# Patient Record
Sex: Female | Born: 1975 | Race: Black or African American | Hispanic: No | Marital: Married | Smoking: Never smoker
Health system: Southern US, Community
[De-identification: ages and names within clinical notes are randomized; demographics above are authoritative.]

---

## 2005-02-05 ENCOUNTER — Inpatient Hospital Stay (HOSPITAL_COMMUNITY): Admission: AD | Admit: 2005-02-05 | Discharge: 2005-02-05 | Payer: Self-pay | Admitting: *Deleted

## 2005-02-15 ENCOUNTER — Ambulatory Visit (HOSPITAL_COMMUNITY): Admission: RE | Admit: 2005-02-15 | Discharge: 2005-02-15 | Payer: Self-pay | Admitting: Obstetrics & Gynecology

## 2005-02-17 ENCOUNTER — Inpatient Hospital Stay (HOSPITAL_COMMUNITY): Admission: AD | Admit: 2005-02-17 | Discharge: 2005-02-20 | Payer: Self-pay | Admitting: Obstetrics

## 2005-02-18 ENCOUNTER — Encounter (INDEPENDENT_AMBULATORY_CARE_PROVIDER_SITE_OTHER): Payer: Self-pay | Admitting: Specialist

## 2006-05-03 HISTORY — PX: OTHER SURGICAL HISTORY: SHX169

## 2006-06-02 ENCOUNTER — Ambulatory Visit (HOSPITAL_COMMUNITY): Admission: RE | Admit: 2006-06-02 | Discharge: 2006-06-02 | Payer: Self-pay | Admitting: Obstetrics

## 2006-09-14 ENCOUNTER — Inpatient Hospital Stay (HOSPITAL_COMMUNITY): Admission: AD | Admit: 2006-09-14 | Discharge: 2006-09-16 | Payer: Self-pay | Admitting: Obstetrics

## 2006-09-15 ENCOUNTER — Encounter (INDEPENDENT_AMBULATORY_CARE_PROVIDER_SITE_OTHER): Payer: Self-pay | Admitting: Specialist

## 2010-09-15 NOTE — Op Note (Signed)
NAME:  Joan Adams, Joan Adams NO.:  [id number]   MEDICAL RECORD NO.:  [id number]          PATIENT TYPE:  INP   LOCATION:  9111                          FACILITY:  [facility]   PHYSICIAN:  Charles A. Clearance Coots, M.D.DATE OF BIRTH:  10/06/1975   DATE OF PROCEDURE:  09/15/2006  DATE OF DISCHARGE:                               OPERATIVE REPORT   PREOPERATIVE DIAGNOSIS:  Desires sterilization.   POSTOPERATIVE DIAGNOSIS:  Desires sterilization.   PROCEDURE:  Bilateral partial salpingectomy.   SURGEON:  Charles A. Clearance Coots, M.D.   ANESTHESIA:  Spinal.   ESTIMATED BLOOD LOSS:  Negligible.   COMPLICATIONS:  None.   SPECIMEN:  Approximately 2 cm segments of right and left fallopian  tubes.   OPERATION:  The patient was brought to the operating room, and after  satisfactory general endotracheal anesthesia, the abdomen was prepped  and draped in the usual sterile fashion.   A small inferior umbilical incision was made with a scalpel that was  deepened down to the fascia with curved Mayo scissors bluntly.  Right-  angle retractors were then placed in the incision, and the fascia was  grasped in the midline with Kocher forceps and was cut transversely with  curved Mayo scissors.  The fascial incision was extended to the left and  to the right with the curved Mayo scissors.  The peritoneum was grasped  with hemostats and was incised with Metzenbaum scissors.  Right-angle  retractors were then placed in the incision.  The right fallopian tube  was identified and was grasped with a Babcock clamp.  The tube was  followed from the cornual and to the fimbrial end and grasped with  Babcock clamps and was then regrasped in the isthmic area of the tube  with the Babcock clamp.  A knuckle of tube beneath the Babcock clamp was  ligated with 0 plain catgut, and the section of tube above the knot was  excised with Metzenbaum scissors and submitted to pathology for  evaluation.  There was no active  bleeding from the tubal stump.  It was  therefore placed back in its normal anatomic position.  The same  procedure was performed on the opposite side without complications.  The  abdomen was then closed as follows.  The peritoneum and fascia was  closed as one with continuous suture of 2-0 Vicryl.  The skin was closed  with continuous  subcuticular suture of 3-0 Monocryl.  A sterile bandage was applied to  the incision closure.  The surgical technician indicated that all  needle, sponge, and instrument counts were correct x2.   The patient tolerated procedure well and was transported to the recovery  room in satisfactory condition.      Charles A. Clearance Coots, M.D.  Electronically Signed     CAH/MEDQ  D:  09/15/2006  T:  09/15/2006  Job:  [id number]

## 2010-09-15 NOTE — Discharge Summary (Signed)
NAME:  Joan Adams, Joan Adams NO.:  [id number]   MEDICAL RECORD NO.:  [id number]          PATIENT TYPE:  INP   LOCATION:  9111                          FACILITY:  [facility]   PHYSICIAN:  Charles A. Clearance Coots, M.D.DATE OF BIRTH:  1975/10/27   DATE OF ADMISSION:  09/14/2006  DATE OF DISCHARGE:  09/16/2006                               DISCHARGE SUMMARY   ADMITTING DIAGNOSIS:  Term pregnancy active labor, desires  sterilization.   DISCHARGE DIAGNOSIS:  Term pregnancy active labor, desires  sterilization, status post normal spontaneous vaginal delivery, viable  alive female on 09/14/2006 at 2057.  Apgar's of 9 at 1 minute, 9 at 5  minutes.  Weight of 3300 grams, length of 52.5-cm.   Mother and infant discharged home in good condition.   REASON FOR ADMISSION:  A 35 year old black female G4, P3, EDC of  09/11/2006 presents with uterine contractions.  Prenatal care was  uncomplicated.  Group B strep was negative.   PAST MEDICAL HISTORY:  Surgery none.  Illnesses none.   MEDICATIONS:  Prenatal vitamins.   ALLERGIES:  NO KNOWN DRUG ALLERGIES.   SOCIAL HISTORY:  Married.  Negative tobacco, alcohol or recreational  drug use.   FAMILY HISTORY:  No major diseases.   PHYSICAL EXAMINATION:  GENERAL:  Well nourished, well developed black  female in no acute distress.  VITAL SIGNS:  Blood pressure 131/83.  LUNGS:  Clear to auscultation bilaterally.  HEART:  Regular rate and rhythm.  ABDOMEN:  Gravid, nontender.  CERVIX:  6 to 7-cm, 100% effaced, the vertex at 0 stage.  External fetal  monitor reactive tracing.  Uterine contractions every 3 to 4 minutes.   ADMITTING LABORATORY VALUES:  Hemoglobin 10, hematocrit 33, white blood  cell count 16,000, platelets 197,000.   HOSPITAL COURSE:  The patient was admitted and active rupture of  membranes was performed and she then progressed to a rapid first stage  of labor and uncomplicated normal spontaneous vaginal delivery.  The  patient  is taken for a postpartum tubal ligation on postpartum day  number 1.  Bilateral partial salpingectomy was performed without  complications.  The remainder of the postpartum course and postoperative  course was uncomplicated.  The patient was discharged home on postpartum  day number 2 in good condition.   DISCHARGE LABORATORY VALUES:  Hemoglobin 9.7, hematocrit 28.2, white  blood cell count 12,000, platelets 178,000.   DISCHARGE DISPOSITION:  Medications Tylox and ibuprofen was prescribed  for pain.  Continue prenatal vitamins.  Routine written instructions  were given for discharge after vaginal delivery, tubal ligation.  The  patient is to call office for followup appointment in 2 weeks.      Charles A. Clearance Coots, M.D.  Electronically Signed     CAH/MEDQ  D:  09/16/2006  T:  09/16/2006  Job:  [id number]

## 2011-09-13 ENCOUNTER — Encounter: Payer: Self-pay | Admitting: Obstetrics and Gynecology

## 2011-09-13 ENCOUNTER — Ambulatory Visit (INDEPENDENT_AMBULATORY_CARE_PROVIDER_SITE_OTHER): Payer: BC Managed Care – PPO | Admitting: Obstetrics and Gynecology

## 2011-09-13 VITALS — BP 116/64 | Ht 68.0 in | Wt 185.0 lb

## 2011-09-13 DIAGNOSIS — Z124 Encounter for screening for malignant neoplasm of cervix: Secondary | ICD-10-CM

## 2011-09-13 DIAGNOSIS — E663 Overweight: Secondary | ICD-10-CM

## 2011-09-13 DIAGNOSIS — R102 Pelvic and perineal pain unspecified side: Secondary | ICD-10-CM

## 2011-09-13 DIAGNOSIS — Z9851 Tubal ligation status: Secondary | ICD-10-CM

## 2011-09-13 DIAGNOSIS — N949 Unspecified condition associated with female genital organs and menstrual cycle: Secondary | ICD-10-CM

## 2011-09-13 DIAGNOSIS — Z01419 Encounter for gynecological examination (general) (routine) without abnormal findings: Secondary | ICD-10-CM

## 2011-09-13 DIAGNOSIS — Z113 Encounter for screening for infections with a predominantly sexual mode of transmission: Secondary | ICD-10-CM

## 2011-09-13 MED ORDER — TRAMADOL HCL 50 MG PO TABS: 50.0000 mg | ORAL_TABLET | Freq: Four times a day (QID) | ORAL | Status: AC | PRN

## 2011-09-13 NOTE — Progress Notes (Signed)
The patient reports:she complains of pain states that she believes it is with ovulation.   Contraception:bilateral tubal ligation  Last mammogram: not applicable  Last pap: was normal 2008  GC/Chlamydia cultures offered: requested HIV/RPR/HbsAg offered:  requested HSV 1 and 2 glycoprotein offered:  requested  Menstrual cycle regular and monthly: Yes Menstrual flow normal: Yes  Urinary symptoms: none Normal bowel movements: Yes Reports abuse at home: No:   Subjective:    Joan Adams is a 36 y.o. female, [id number], who presents for an annual exam. See above. This is her first visit to this office.  She complains of a vague left lower quadrant pain at the time of ovulation.  She has normal GI and GU function.  She denies fever and chills.  She is sexually active and wants to rule out sexual transmitted infections.  She is status post tubal ligation. On the worst day her pain is rated at 2/10.  Prior Hysterectomy: No    History   Social History  . Marital Status: Married    Spouse Name: N/A    Number of Children: N/A  . Years of Education: N/A   Social History Main Topics  . Smoking status: Never Smoker   . Smokeless tobacco: None  . Alcohol Use: Yes     occasional   . Drug Use: No  . Sexually Active: Yes    Birth Control/ Protection: Other-see comments     BTL   Other Topics Concern  . None   Social History Narrative  . None    Menstrual cycle:   LMP: Patient's last menstrual period was 09/07/2011.           Cycle: Regular, monthly with normal flow and no severe dysmenorrha  The following portions of the patient's history were reviewed and updated as appropriate: allergies, current medications, past family history, past medical history, past social history, past surgical history and problem list.  Review of Systems Pertinent items are noted in HPI. Breast:Negative for breast lump,nipple discharge or nipple retraction Gastrointestinal: Negative for abdominal pain,  change in bowel habits or rectal bleeding Urinary:negative   Objective:    BP 116/64  Ht 5\' 8"  (1.727 m)  Wt 185 lb (83.915 kg)  BMI 28.13 kg/m2  LMP 09/07/2011    Weight:  Wt Readings from Last 1 Encounters:  09/13/11 185 lb (83.915 kg)          BMI: Body mass index is 28.13 kg/(m^2).  General Appearance: Alert, appropriate appearance for age. No acute distress HEENT: Grossly normal Neck / Thyroid: Supple, no masses, nodes or enlargement Lungs: clear to auscultation bilaterally Back: No CVA tenderness Breast Exam: No masses or nodes.No dimpling, nipple retraction or discharge. Cardiovascular: Regular rate and rhythm. S1, S2, no murmur Gastrointestinal: Soft, non-tender, no masses or organomegaly  ++++++++++++++++++++++++++++++++++++++++++++++++++++++++  Pelvic Exam: External genitalia: normal general appearance Vaginal: normal without tenderness, induration or masses and cystocele is present Cervix: normal appearance Adnexa: normal bimanual exam Uterus: normal size shape and consistency Rectovaginal: normal rectal, no masses  ++++++++++++++++++++++++++++++++++++++++++++++++++++++++  Lymphatic Exam: Non-palpable nodes in neck, clavicular, axillary, or inguinal regions Neurologic: Normal speech, no tremor  Psychiatric: Alert and oriented, appropriate affect.   Wet Prep:not applicable Urinalysis:not applicable UPT: Not done   Assessment:    Normal gyn exam   Overweight or obese: Yes   Pelvic relaxation: Yes  Rule out STD  Date of left lower quadrant pain   Plan:    pap smear return annually or prn  Contraception:bilateral tubal ligation    STD screen request: GC, chlamydia, HSV, HIV  RPR: Yes.   HBsAg: Yes.  Hepatitis C: Yes.  The updated Pap smear screening guidelines were discussed with the patient. The patient requested that I obtain a Pap smear: Yes.  Kegel exercises discussed: Yes.  Proper diet and regular exercise were reviewed.  Annual  mammograms recommended starting at age 82. Proper breast care was discussed.  Screening colonoscopy is recommended beginning at age 48.  Regular health maintenance was reviewed.  Return to office in 2 weeks for an ultrasound.  Ultram 50 mg one tablet every 4 hours as needed for pain.  Mylinda Latina.D.

## 2011-09-14 LAB — HIV ANTIBODY (ROUTINE TESTING W REFLEX): HIV: NONREACTIVE

## 2011-09-14 LAB — HEPATITIS C ANTIBODY: HCV Ab: NEGATIVE

## 2011-09-14 LAB — HEPATITIS B SURFACE ANTIGEN: Hepatitis B Surface Ag: NEGATIVE

## 2011-09-14 LAB — RPR

## 2011-09-14 LAB — HSV 2 ANTIBODY, IGG: HSV 2 Glycoprotein G Ab, IgG: 0.14 IV

## 2011-09-14 LAB — HSV 1 ANTIBODY, IGG: HSV 1 Glycoprotein G Ab, IgG: 8.01 IV — ABNORMAL HIGH

## 2011-09-16 LAB — PAP IG, CT-NG, RFX HPV ASCU
Chlamydia Probe Amp: NEGATIVE
GC Probe Amp: NEGATIVE

## 2011-10-13 ENCOUNTER — Other Ambulatory Visit: Payer: BC Managed Care – PPO

## 2011-10-13 ENCOUNTER — Encounter: Payer: BC Managed Care – PPO | Admitting: Obstetrics and Gynecology

## 2011-12-16 ENCOUNTER — Encounter: Payer: BC Managed Care – PPO | Admitting: Obstetrics and Gynecology

## 2011-12-16 ENCOUNTER — Other Ambulatory Visit: Payer: BC Managed Care – PPO

## 2014-03-04 ENCOUNTER — Encounter: Payer: Self-pay | Admitting: Obstetrics and Gynecology

## 2019-03-02 ENCOUNTER — Other Ambulatory Visit (HOSPITAL_COMMUNITY)
Admission: RE | Admit: 2019-03-02 | Discharge: 2019-03-02 | Disposition: A | Discharge status: Home / Self Care | Payer: BC Managed Care – PPO | Source: Ambulatory Visit | Attending: Obstetrics and Gynecology | Admitting: Obstetrics and Gynecology

## 2019-03-02 ENCOUNTER — Other Ambulatory Visit: Payer: Self-pay | Admitting: Obstetrics and Gynecology

## 2019-03-02 DIAGNOSIS — Z1231 Encounter for screening mammogram for malignant neoplasm of breast: Secondary | ICD-10-CM

## 2019-03-02 DIAGNOSIS — Z124 Encounter for screening for malignant neoplasm of cervix: Secondary | ICD-10-CM | POA: Insufficient documentation

## 2019-03-07 LAB — CYTOLOGY - PAP
Adequacy: ABSENT
Comment: NEGATIVE
Diagnosis: NEGATIVE
High risk HPV: NEGATIVE

## 2019-03-08 ENCOUNTER — Other Ambulatory Visit: Payer: Self-pay

## 2019-03-08 ENCOUNTER — Ambulatory Visit
Admission: RE | Admit: 2019-03-08 | Discharge: 2019-03-08 | Disposition: A | Discharge status: Home / Self Care | Payer: BC Managed Care – PPO | Source: Ambulatory Visit | Attending: Obstetrics and Gynecology | Admitting: Obstetrics and Gynecology

## 2019-03-08 DIAGNOSIS — Z1231 Encounter for screening mammogram for malignant neoplasm of breast: Secondary | ICD-10-CM

## 2019-07-02 ENCOUNTER — Ambulatory Visit: Payer: BC Managed Care – PPO | Attending: Internal Medicine

## 2019-07-02 DIAGNOSIS — Z23 Encounter for immunization: Secondary | ICD-10-CM | POA: Insufficient documentation

## 2019-07-02 NOTE — Progress Notes (Signed)
   Covid-19 Vaccination Clinic  Name:  Joan Adams    MRN: [id number] DOB: 1975-12-27  07/02/2019  Joan Adams was observed post Covid-19 immunization for 15 minutes without incidence. She was provided with Vaccine Information Sheet and instruction to access the V-Safe system.   Joan Adams was instructed to call 911 with any severe reactions post vaccine: Marland Kitchen Difficulty breathing  . Swelling of your face and throat  . A fast heartbeat  . A bad rash all over your body  . Dizziness and weakness    Immunizations Administered    Name Date Dose VIS Date Route   Pfizer COVID-19 Vaccine 07/02/2019 10:16 AM 0.3 mL 04/13/2019 Intramuscular   Manufacturer: [employer], [employer]   Lot: [id number]   NDC: [address]-2

## 2019-07-25 ENCOUNTER — Ambulatory Visit: Payer: BC Managed Care – PPO | Attending: Internal Medicine

## 2019-07-25 DIAGNOSIS — Z23 Encounter for immunization: Secondary | ICD-10-CM

## 2019-07-25 NOTE — Progress Notes (Signed)
   Covid-19 Vaccination Clinic  Name:  Joan Adams    MRN: [id number] DOB: [birth year]  07/25/2019  [name] was observed post Covid-19 immunization for 15 minutes without incident. She was provided with Vaccine Information Sheet and instruction to access the V-Safe system.   [name] was instructed to call 911 with any severe reactions post vaccine: Marland Kitchen Difficulty breathing  . Swelling of face and throat  . A fast heartbeat  . A bad rash all over body  . Dizziness and weakness   Immunizations Administered    Name Date Dose VIS Date Route   Pfizer COVID-19 Vaccine 07/25/2019 12:13 PM 0.3 mL 04/13/2019 Intramuscular   Manufacturer: [employer], [employer]   Lot: [id number]   NDC: [address]-5

## 2020-05-15 IMAGING — MG DIGITAL SCREENING BILAT W/ TOMO W/ CAD
6 series · 6 of 18 positions shown · non-contrast
Comparison: None.

CLINICAL DATA: Screening.

EXAM:
DIGITAL SCREENING BILATERAL MAMMOGRAM WITH TOMO AND CAD

[L MLO synth-2D]
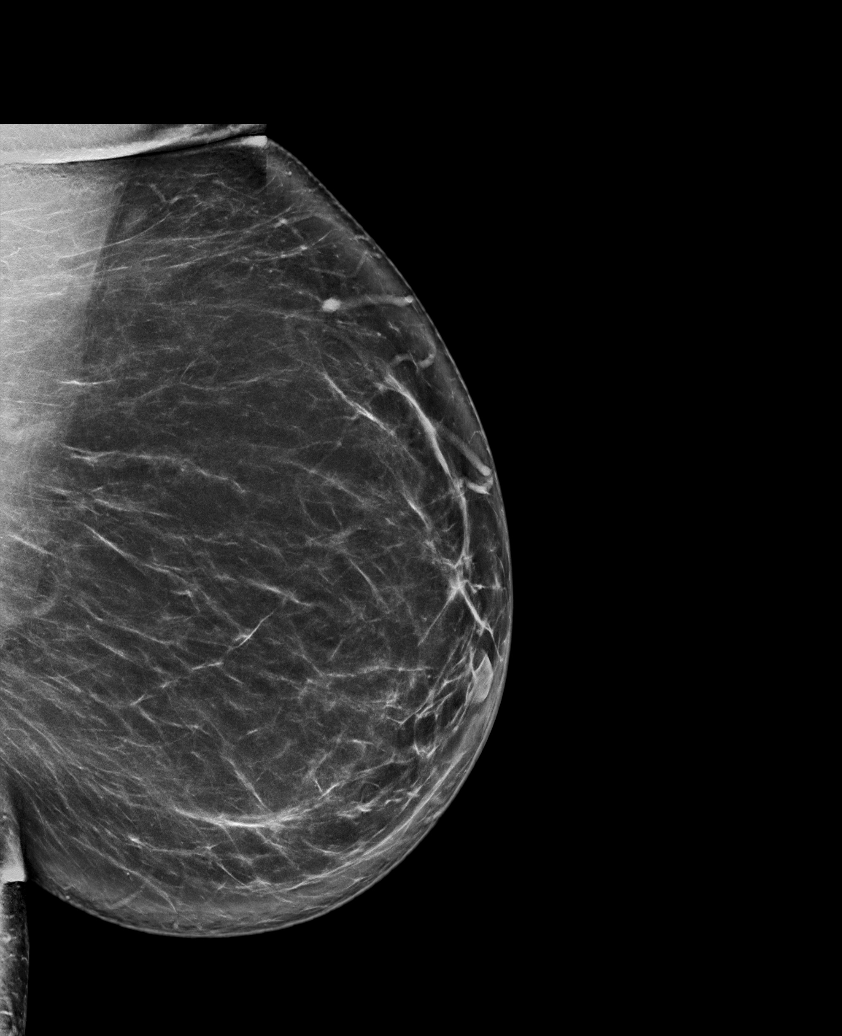

[L CC synth-2D]
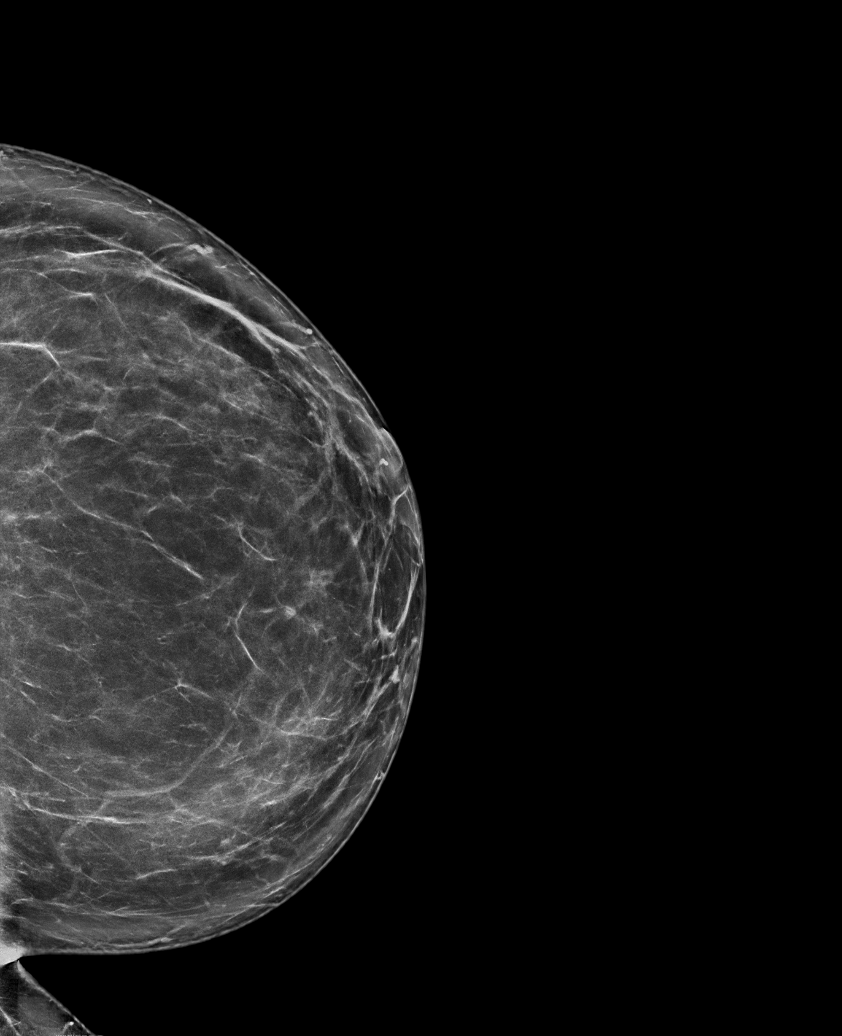

[R CC synth-2D]
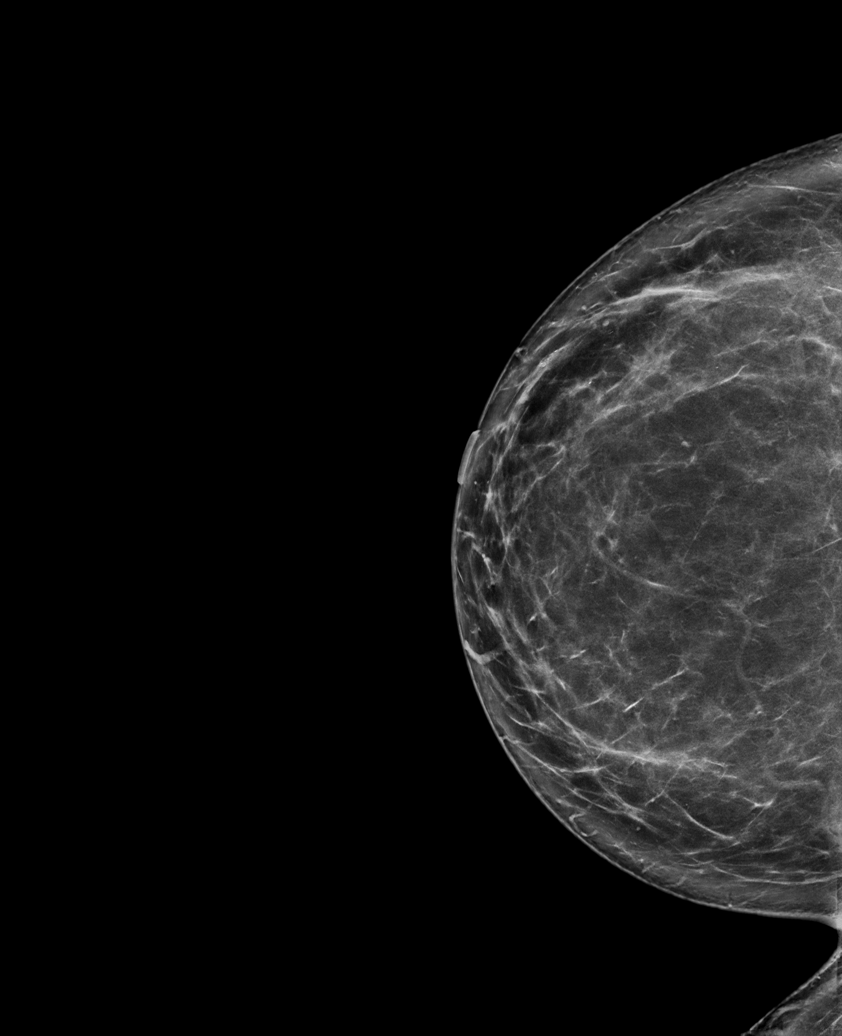

[L MLO tomo · tomo slice 51/102.0]
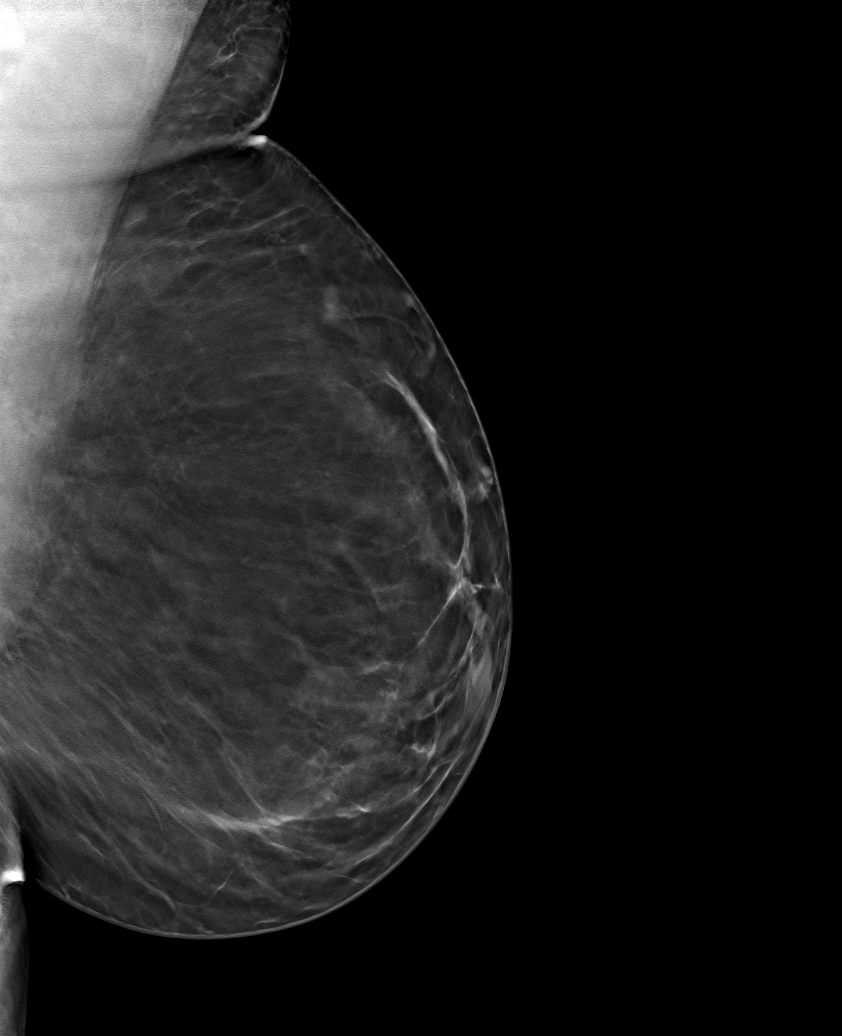

[R CC tomo · tomo slice 47/93.0]
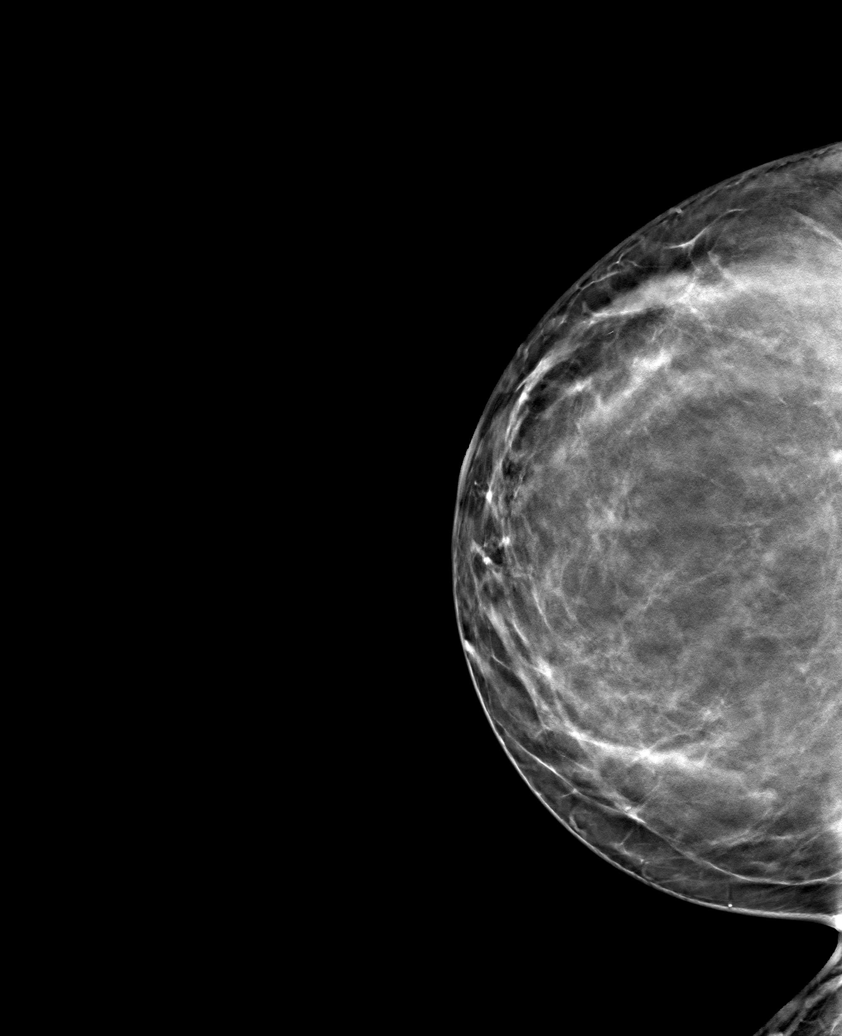

[L CC tomo · tomo slice 47/92.0]
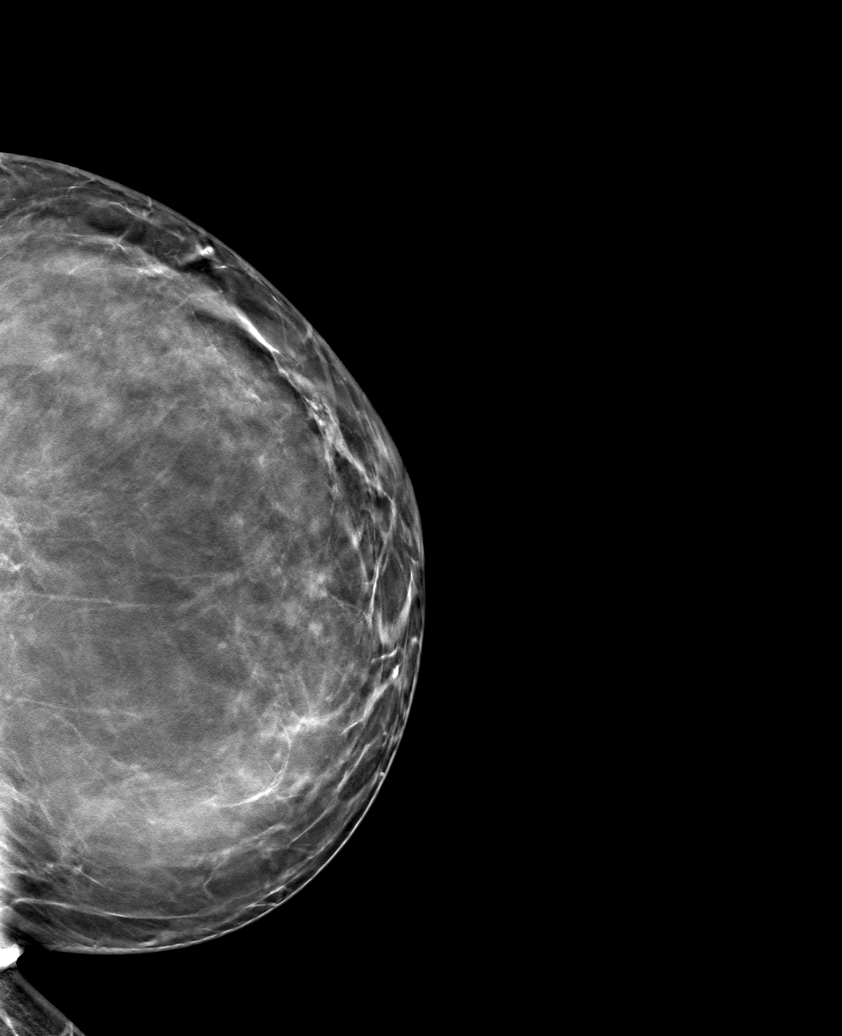

[6 of 18 positions shown; findings below may reference images not displayed]

ACR Breast Density Category b: There are scattered areas of
fibroglandular density.
FINDINGS: There are no findings suspicious for malignancy. Images were
processed with CAD.
IMPRESSION: No mammographic evidence of malignancy. A result letter of this
screening mammogram will be mailed directly to the patient.

RECOMMENDATION:
Screening mammogram in one year. (Code:Y5-G-EJ6)

BI-RADS CATEGORY  1: Negative.

## 2022-12-29 ENCOUNTER — Other Ambulatory Visit (HOSPITAL_BASED_OUTPATIENT_CLINIC_OR_DEPARTMENT_OTHER): Payer: Self-pay
